# Patient Record
Sex: Female | Born: 2000 | Race: Black or African American | Hispanic: No | Marital: Single | Smoking: Never smoker
Health system: Southern US, Community
[De-identification: ages and names within clinical notes are randomized; demographics above are authoritative.]

## PROBLEM LIST (undated history)

## (undated) DIAGNOSIS — J45909 Unspecified asthma, uncomplicated: Secondary | ICD-10-CM

## (undated) DIAGNOSIS — Z789 Other specified health status: Secondary | ICD-10-CM

## (undated) HISTORY — PX: NO PAST SURGERIES: SHX2092

---
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
## 2018-05-29 ENCOUNTER — Encounter

## 2019-06-08 ENCOUNTER — Emergency Department (HOSPITAL_COMMUNITY)
Admission: EM | Admit: 2019-06-08 | Discharge: 2019-06-09 | Disposition: A | Discharge status: Home / Self Care | Payer: BLUE CROSS/BLUE SHIELD | Attending: Emergency Medicine | Admitting: Emergency Medicine

## 2019-06-08 ENCOUNTER — Other Ambulatory Visit: Payer: Self-pay

## 2019-06-08 ENCOUNTER — Encounter (HOSPITAL_COMMUNITY): Payer: Self-pay | Admitting: Emergency Medicine

## 2019-06-08 DIAGNOSIS — J45909 Unspecified asthma, uncomplicated: Secondary | ICD-10-CM | POA: Insufficient documentation

## 2019-06-08 DIAGNOSIS — J029 Acute pharyngitis, unspecified: Secondary | ICD-10-CM | POA: Diagnosis not present

## 2019-06-08 DIAGNOSIS — Z79899 Other long term (current) drug therapy: Secondary | ICD-10-CM | POA: Diagnosis not present

## 2019-06-08 LAB — GROUP A STREP BY PCR: Group A Strep by PCR: NOT DETECTED

## 2019-06-08 NOTE — ED Triage Notes (Signed)
Pt c/o sore throat and swollen tonsils. °

## 2019-06-08 NOTE — ED Provider Notes (Signed)
[facility]ERGENCY DEPARTMENT Provider Note   CSN: [id number] Arrival date & time: 06/08/19  1814     History   Chief Complaint Chief Complaint  Patient presents with   Sore Throat    HPI Samantha Rivas is a 18 y.o. female.     The history is provided by the patient. No language interpreter was used.  Sore Throat This is a new problem. The current episode started more than 2 days ago. The problem occurs constantly. The problem has been gradually worsening. Pertinent negatives include no chest pain, no abdominal pain, no headaches and no shortness of breath. The symptoms are aggravated by swallowing and eating. Nothing relieves the symptoms. She has tried a cold compress for the symptoms.    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 Authorizing Provider  albuterol (PROVENTIL HFA;VENTOLIN HFA) 108 (90 BASE) MCG/ACT inhaler Inhale 2 puffs into the lungs every 6 (six) hours as needed for wheezing. May use 10 min before exercise 09/04/12   Clovis Cao, MD  ibuprofen (MOTRIN IB) 200 MG tablet Take 2 tablets (400 mg total) by mouth every 6 (six) hours as needed. 07/01/17   Seabron Spates, CNM  metroNIDAZOLE (FLAGYL) 500 MG tablet Take 1 tablet (500 mg total) by mouth 2 (two) times daily. 07/22/17   Julianne Handler, CNM    Family  History Family History  Problem Relation Age of Onset   Glaucoma Other    Obesity Other    Hypertension Other    Cancer Other     Social History Social History   Tobacco Use   Smoking status: Never Smoker   Smokeless tobacco: Never Used  Substance Use Topics   Alcohol use: No   Drug use: No     Allergies   Penicillins, Nyquil [pseudoeph-doxylamine-dm-apap], Phenylephrine hcl, Red dye, and Robitussin [guaifenesin]   Review of Systems Review of Systems  Constitutional: Positive for appetite change. Negative for chills and fever.  HENT: Positive for sore throat and trouble swallowing. Negative for drooling.   Respiratory: Negative for cough, choking, shortness of breath and stridor.   Cardiovascular: Negative for chest pain.  Gastrointestinal: Negative for abdominal pain.  Neurological: Negative for headaches.     Physical Exam Updated Vital Signs BP (!) 107/58 (BP Location: Right Arm)    Pulse 77    Temp 100.3 F (37.9 C) (Oral)    Resp 16    SpO2 100%   Physical Exam Vitals signs and nursing note reviewed.  Constitutional:      General: She is not in acute  distress.    Appearance: She is well-developed. She is not diaphoretic.  HENT:     Head: Normocephalic and atraumatic.     Right Ear: Tympanic membrane normal.     Left Ear: Tympanic membrane normal.     Mouth/Throat:     Mouth: Mucous membranes are moist.     Pharynx: Uvula midline. Pharyngeal swelling and posterior oropharyngeal erythema present. No oropharyngeal exudate or uvula swelling.     Tonsils: No tonsillar exudate or tonsillar abscesses.  Eyes:     General: No scleral icterus.    Conjunctiva/sclera: Conjunctivae normal.  Neck:     Musculoskeletal: Normal range of motion.  Cardiovascular:     Rate and Rhythm: Normal rate and regular rhythm.     Heart sounds: Normal heart sounds. No murmur. No friction rub. No gallop.   Pulmonary:     Effort: Pulmonary effort is normal. No respiratory  distress.     Breath sounds: Normal breath sounds.  Abdominal:     General: Bowel sounds are normal. There is no distension.     Palpations: Abdomen is soft. There is no mass.     Tenderness: There is no abdominal tenderness. There is no guarding.  Skin:    General: Skin is warm and dry.  Neurological:     Mental Status: She is alert and oriented to person, place, and time.  Psychiatric:        Behavior: Behavior normal.      ED Treatments / Results  Labs (all labs ordered are listed, but only abnormal results are displayed) Labs Reviewed  GROUP A STREP BY PCR    EKG None  Radiology No results found.  Procedures Procedures (including critical care time)  Medications Ordered in ED Medications - No data to display   Initial Impression / Assessment and Plan / ED Course  I have reviewed the triage vital signs and the nursing notes.  Pertinent labs & imaging results that were available during my care of the patient were reviewed by me and considered in my medical decision making (see chart for details).  Clinical Course as of Jun 08 2303  Tue Jun 08, 2019  2254 Resp: 16 [ET]    Clinical Course User Index [ET] Francene Boyershompson, Elizabeth A, Student-PA       Pt afebrile without tonsillar exudate, negative strep. Presents with mild cervical lymphadenopathy, & dysphagia; diagnosis of viral pharyngitis. No abx indicated. DC w symptomatic tx for pain  Pt does not appear dehydrated, but did discuss importance of water rehydration. Presentation non concerning for PTA or infxn spread to soft tissue. No trismus or uvula deviation. Specific return precautions discussed. Pt able to drink water in ED without difficulty with intact air way. Recommended PCP follow up.  Final Clinical Impressions(s) / ED Diagnoses   Final diagnoses:  Viral pharyngitis    ED Discharge Orders    None       Arthor CaptainHarris, Laquesha Holcomb, PA-C 06/09/19 1651    Benjiman CorePickering, Nathan, MD 06/09/19 [id number]

## 2019-06-09 NOTE — Discharge Instructions (Addendum)
Follow these instructions at home:  Take over-the-counter and prescription medicines only as told by your health care provider.  If you were prescribed an antibiotic medicine, take it as told by your health care provider. Do not stop taking the antibiotic even if you start to feel better.  Do not give children aspirin because of the association with Reye syndrome.  Drink enough water and fluids to keep your urine clear or pale yellow.  Get a lot of rest.  Gargle with a salt-water mixture 3-4 times a day or as needed. To make a salt-water mixture, completely dissolve -1 tsp of salt in 1 cup of warm water.  If your health care provider approves, you may use throat lozenges or sprays to soothe your throat.  Contact a health care provider if:  You have large, tender lumps in your neck.  You have a rash.  You cough up green, yellow-brown, or bloody spit.  Get help right away if:  Your neck becomes stiff.  You drool or are unable to swallow liquids.  You cannot drink or take medicines without vomiting.  You have severe pain that does not go away, even after you take medicine.  You have trouble breathing, and it is not caused by a stuffy nose.  You have new pain and swelling in your joints such as the knees, ankles, wrists, or elbows.

## 2019-10-29 ENCOUNTER — Telehealth: Payer: BLUE CROSS/BLUE SHIELD | Admitting: Family

## 2019-10-29 DIAGNOSIS — Z20822 Contact with and (suspected) exposure to covid-19: Secondary | ICD-10-CM

## 2019-10-29 NOTE — Progress Notes (Signed)
E-Visit for .Corona. Virus Screening   Your current symptoms could be consistent with the coronavirus.  Many health care providers can now test patients at their office but not all are.  [facility] has multiple testing sites. For information on our COVID testing locations and hours go to [email or web address]  Please quarantine yourself while awaiting your test results.  We are enrolling you in our MyChart Home Montioring for COVID19 . Daily you will receive a questionnaire within the MyChart website. Our COVID 19 response team willl be monitoriing your responses daily. Please continue good preventive care measures, including:  frequent hand-washing, avoid touching your face, cover coughs/sneezes, stay out of crowds and keep a 6 foot distance from others.    COVID-19 is a respiratory illness with symptoms that are similar to the flu. Symptoms are typically mild to moderate, but there have been cases of severe illness and death due to the virus. The following symptoms may appear 2-14 days after exposure: . Fever . Cough . Shortness of breath or difficulty breathing . Chills . Repeated shaking with chills . Muscle pain . Headache . Sore throat . New loss of taste or smell . Fatigue . Congestion or runny nose . Nausea or vomiting . Diarrhea  If you develop fever/cough/breathlessness, please stay home for 10 days with improving symptoms and until you have had 24 hours of no fever (without taking a fever reducer).  Go to the nearest hospital ED for assessment if fever/cough/breathlessness are severe or illness seems like a threat to life.  It is vitally important that if you feel that you have an infection such as this virus or any other virus that you stay home and away from places where you may spread it to others.  You should avoid contact with people age 65 and older.   You should wear a mask or cloth face covering over your nose and mouth if you must be around other  people or animals, including pets (even at home). Try to stay at least 6 feet away from other people. This will protect the people around you.  You may also take acetaminophen (Tylenol) as needed for fever.   Reduce your risk of any infection by using the same precautions used for avoiding the common cold or flu:  . Wash your hands often with soap and warm water for at least 20 seconds.  If soap and water are not readily available, use an alcohol-based hand sanitizer with at least 60% alcohol.  . If coughing or sneezing, cover your mouth and nose by coughing or sneezing into the elbow areas of your shirt or coat, into a tissue or into your sleeve (not your hands). . Avoid shaking hands with others and consider head nods or verbal greetings only. . Avoid touching your eyes, nose, or mouth with unwashed hands.  . Avoid close contact with people who are sick. . Avoid places or events with large numbers of people in one location, like concerts or sporting events. . Carefully consider travel plans you have or are making. . If you are planning any travel outside or inside the [place], visit the CDC's Travelers' Health webpage for the latest health notices. . If you have some symptoms but not all symptoms, continue to monitor at home and seek medical attention if your symptoms worsen. . If you are having a medical emergency, call 911.  HOME CARE . Only take medications as instructed by your medical team. . Drink plenty of fluids and get   plenty of rest. . A steam or ultrasonic humidifier can help if you have congestion.   GET HELP RIGHT AWAY IF YOU HAVE EMERGENCY WARNING SIGNS** FOR COVID-19. If you or someone is showing any of these signs seek emergency medical care immediately. Call 911 or proceed to your closest emergency facility if: . You develop worsening high fever. . Trouble breathing . Bluish lips or face . Persistent pain or pressure in the chest . New confusion . Inability to wake or stay  awake . You cough up blood. . Your symptoms become more severe  **This list is not all possible symptoms. Contact your medical provider for any symptoms that are sever or concerning to you.   MAKE SURE YOU   Understand these instructions.  Will watch your condition.  Will get help right away if you are not doing well or get worse.  Your e-visit answers were reviewed by a board certified advanced clinical practitioner to complete your personal care plan.  Depending on the condition, your plan could have included both over the counter or prescription medications.  If there is a problem please reply once you have received a response from your provider.  Your safety is important to us.  If you have drug allergies check your prescription carefully.    You can use MyChart to ask questions about today's visit, request a non-urgent call back, or ask for a work or school excuse for 24 hours related to this e-Visit. If it has been greater than 24 hours you will need to follow up with your provider, or enter a new e-Visit to address those concerns. You will get an e-mail in the next two days asking about your experience.  I hope that your e-visit has been valuable and will speed your recovery. Thank you for using e-visits.   Greater than 5 minutes, yet less than 10 minutes of time have been spent researching, coordinating, and implementing care for this patient today.  Thank you for the details you included in the comment boxes. Those details are very helpful in determining the best course of treatment for you and help us to provide the best care.  

## 2020-04-20 ENCOUNTER — Other Ambulatory Visit: Payer: Self-pay

## 2020-04-20 ENCOUNTER — Emergency Department (HOSPITAL_COMMUNITY)
Admission: EM | Admit: 2020-04-20 | Discharge: 2020-04-20 | Disposition: A | Discharge status: Home / Self Care | Payer: BLUE CROSS/BLUE SHIELD | Attending: Emergency Medicine | Admitting: Emergency Medicine

## 2020-04-20 ENCOUNTER — Encounter (HOSPITAL_COMMUNITY): Payer: Self-pay | Admitting: Emergency Medicine

## 2020-04-20 DIAGNOSIS — B373 Candidiasis of vulva and vagina: Secondary | ICD-10-CM | POA: Diagnosis not present

## 2020-04-20 DIAGNOSIS — B379 Candidiasis, unspecified: Secondary | ICD-10-CM

## 2020-04-20 DIAGNOSIS — L299 Pruritus, unspecified: Secondary | ICD-10-CM | POA: Diagnosis present

## 2020-04-20 LAB — URINALYSIS, ROUTINE W REFLEX MICROSCOPIC
Bilirubin Urine: NEGATIVE
Glucose, UA: NEGATIVE mg/dL
Hgb urine dipstick: NEGATIVE
Ketones, ur: NEGATIVE mg/dL
Nitrite: NEGATIVE
Protein, ur: NEGATIVE mg/dL
Specific Gravity, Urine: 1.016 (ref 1.005–1.030)
pH: 7 (ref 5.0–8.0)

## 2020-04-20 LAB — GC/CHLAMYDIA PROBE AMP (~~LOC~~) NOT AT ARMC
Chlamydia: NEGATIVE
Comment: NEGATIVE
Comment: NORMAL
Neisseria Gonorrhea: NEGATIVE

## 2020-04-20 LAB — WET PREP, GENITAL
Clue Cells Wet Prep HPF POC: NONE SEEN
Sperm: NONE SEEN
Trich, Wet Prep: NONE SEEN

## 2020-04-20 MED ORDER — TIOCONAZOLE 6.5 % VA OINT: 1.0000 | TOPICAL_OINTMENT | Freq: Once | VAGINAL | 0 refills | Status: AC

## 2020-04-20 NOTE — ED Notes (Signed)
Patient Alert and oriented to baseline. Stable and ambulatory to baseline. Patient verbalized understanding of the discharge instructions.  Patient belongings were taken by the patient.   

## 2020-04-20 NOTE — ED Triage Notes (Signed)
Pt presents to ED POV. Pt states that she believes she has a yeast infection. No complaints other than vaginal itchiness.

## 2020-04-20 NOTE — ED Provider Notes (Signed)
[facility] EMERGENCY DEPARTMENT Provider Note   CSN: [id number] Arrival date & time: 04/20/20  0106     History Chief Complaint  Patient presents with  . vaginal itchiness    Samantha Rivas is a 19 y.o. female.  The history is provided by the patient.  Vaginal Itching This is a new problem. The current episode started more than 1 week ago (2 weeks ). The problem occurs constantly. The problem has not changed since onset.Pertinent negatives include no chest pain, no abdominal pain, no headaches and no shortness of breath. Nothing aggravates the symptoms. Nothing relieves the symptoms. She has tried nothing for the symptoms. The treatment provided no relief.  used 3 days of monistat no relief.       Past Medical History:  Diagnosis Date  . Asthma   . Medical history non-contributory     Patient Active Problem List   Diagnosis Date Noted  . 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 Well child check 06/19/2011  . VISUAL ACUITY, DECREASED 05/07/2010  . ACNE, MILD 05/07/2010  . 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 NO PAST SURGERIES       OB History    Gravida  0   Para  0   Term  0   Preterm  0   AB  0   Living  0     SAB  0   TAB  0   Ectopic  0   Multiple  0   Live Births  0           Family History  Problem Relation Age of Onset  . Glaucoma Other   . Obesity Other   . Hypertension Other   . Cancer Other     Social History   Tobacco Use  . Smoking status: Never Smoker  . Smokeless tobacco: Never Used  Substance Use Topics  . Alcohol use: No  . Drug use: No    Home Medications Prior to Admission medications   Medication Sig Start Date End Date Taking? Authorizing Provider  albuterol (PROVENTIL HFA;VENTOLIN HFA) 108 (90 BASE) MCG/ACT inhaler Inhale 2 puffs into the lungs every 6 (six) hours as needed for wheezing. May use 10 min before exercise 09/04/12  Yes Durwin Reges, MD    aspirin-acetaminophen-caffeine (EXCEDRIN MIGRAINE) [phone] MG tablet Take 2 tablets by mouth daily as needed for headache or migraine.   Yes [provider]  ibuprofen (MOTRIN IB) 200 MG tablet Take 2 tablets (400 mg total) by mouth every 6 (six) hours as needed. 07/01/17  Yes Aviva Signs, CNM    Allergies    Penicillins, Nyquil [pseudoeph-doxylamine-dm-apap], Phenylephrine hcl, Red dye, and Robitussin [guaifenesin]  Review of Systems   Review of Systems  Constitutional: Negative for unexpected weight change.  HENT: Negative for congestion.   Eyes: Negative for visual disturbance.  Respiratory: Negative for shortness of breath.   Cardiovascular: Negative for chest pain.  Gastrointestinal: Negative for abdominal pain.  Genitourinary: Negative for difficulty urinating, vaginal bleeding and vaginal discharge.  Musculoskeletal: Negative for arthralgias.  Neurological: Negative for headaches.  Psychiatric/Behavioral: Negative for confusion.  All other systems reviewed and are negative.   Physical Exam Updated Vital Signs BP (!) 112/44   Pulse 78   Temp 98.3 F (36.8 C) (Oral)   Resp 17   SpO2 99%   Physical Exam Vitals and nursing note reviewed.  Constitutional:      General: She  is not in acute distress.    Appearance: Normal appearance.  HENT:     Head: Normocephalic and atraumatic.     Nose: Nose normal.  Eyes:     Extraocular Movements: Extraocular movements intact.     Pupils: Pupils are equal, round, and reactive to light.  Cardiovascular:     Rate and Rhythm: Normal rate and regular rhythm.     Pulses: Normal pulses.     Heart sounds: Normal heart sounds.  Pulmonary:     Effort: Pulmonary effort is normal.     Breath sounds: Normal breath sounds.  Abdominal:     General: Abdomen is flat. Bowel sounds are normal.     Tenderness: There is no abdominal tenderness.  Genitourinary:    Comments: Monistat in the vault, chaperone present no CMT no  adnexal tenderness  Musculoskeletal:        General: Normal range of motion.     Cervical back: Normal range of motion and neck supple.  Skin:    General: Skin is warm and dry.     Capillary Refill: Capillary refill takes less than 2 seconds.  Neurological:     General: No focal deficit present.     Mental Status: She is alert and oriented to person, place, and time.     Deep Tendon Reflexes: Reflexes normal.  Psychiatric:        Mood and Affect: Mood normal.        Behavior: Behavior normal.     ED Results / Procedures / Treatments   Labs (all labs ordered are listed, but only abnormal results are displayed) Labs Reviewed  WET PREP, GENITAL - Abnormal; Notable for the following components:      Result Value   Yeast Wet Prep HPF POC PRESENT (*)    WBC, Wet Prep HPF POC MANY (*)    All other components within normal limits  URINALYSIS, ROUTINE W REFLEX MICROSCOPIC - Abnormal; Notable for the following components:   APPearance CLOUDY (*)    Leukocytes,Ua LARGE (*)    Bacteria, UA FEW (*)    All other components within normal limits  GC/CHLAMYDIA PROBE AMP ([place]) NOT AT [facility]    EKG None  Radiology No results found.  Procedures Procedures (including critical care time)  Medications Ordered in ED Medications - No data to display  ED Course  I have reviewed the triage vital signs and the nursing notes.  Pertinent labs & imaging results that were available during my care of the patient were reviewed by me and considered in my medical decision making (see chart for details).    Patient has a red dye allergy and cannot get diflucan as it has dye in it.  Will prescribe tioconazole and have patient follow up with GYN for ongoing care.  Patient will need a pap smear as she is sexually active.    Ricky Doan was evaluated in Emergency Department on 04/20/2020 for the symptoms described in the history of present illness. She was evaluated in the context of the global  COVID-19 pandemic, which necessitated consideration that the patient might be at risk for infection with the SARS-CoV-2 virus that causes COVID-19. Institutional protocols and algorithms that pertain to the evaluation of patients at risk for COVID-19 are in a state of rapid change based on information released by regulatory bodies including the CDC and federal and state organizations. These policies and algorithms were followed during the patient's care in the ED.   Final  Clinical Impression(s) / ED Diagnoses Return for intractable cough, coughing up blood,fevers >100.4 unrelieved by medication, shortness of breath, intractable vomiting, chest pain, shortness of breath, weakness,numbness, changes in speech, facial asymmetry,abdominal pain, passing out,Inability to tolerate liquids or food, cough, altered mental status or any concerns. No signs of systemic illness or infection. The patient is nontoxic-appearing on exam and vital signs are within normal limits.   I have reviewed the triage vital signs and the nursing notes. Pertinent labs &imaging results that were available during my care of the patient were reviewed by me and considered in my medical decision making (see chart for details).After history, exam, and medical workup I feel the patient has beenappropriately medically screened and is safe for discharge home. Pertinent diagnoses were discussed with the patient. Patient was given return precautions.   Falynn Ailey, MD 04/20/20 [phone]

## 2021-04-15 ENCOUNTER — Encounter (HOSPITAL_COMMUNITY): Payer: Self-pay | Admitting: Emergency Medicine

## 2021-04-15 ENCOUNTER — Other Ambulatory Visit: Payer: Self-pay

## 2021-04-15 ENCOUNTER — Emergency Department (HOSPITAL_COMMUNITY)
Admission: EM | Admit: 2021-04-15 | Discharge: 2021-04-15 | Disposition: A | Discharge status: Home / Self Care | Payer: 59 | Attending: Emergency Medicine | Admitting: Emergency Medicine

## 2021-04-15 DIAGNOSIS — J45909 Unspecified asthma, uncomplicated: Secondary | ICD-10-CM | POA: Diagnosis not present

## 2021-04-15 DIAGNOSIS — N3001 Acute cystitis with hematuria: Secondary | ICD-10-CM | POA: Insufficient documentation

## 2021-04-15 DIAGNOSIS — R300 Dysuria: Secondary | ICD-10-CM | POA: Diagnosis present

## 2021-04-15 LAB — URINALYSIS, ROUTINE W REFLEX MICROSCOPIC
Bilirubin Urine: NEGATIVE
Glucose, UA: NEGATIVE mg/dL
Ketones, ur: NEGATIVE mg/dL
Nitrite: NEGATIVE
Protein, ur: 100 mg/dL — AB
RBC / HPF: >50 RBC/hpf — ABNORMAL HIGH (ref 0–5)
Specific Gravity, Urine: 1.004 — ABNORMAL LOW (ref 1.005–1.030)
WBC, UA: >50 WBC/hpf — ABNORMAL HIGH (ref 0–5)
pH: 6 (ref 5.0–8.0)

## 2021-04-15 LAB — PREGNANCY, URINE: Preg Test, Ur: NEGATIVE

## 2021-04-15 MED ORDER — NITROFURANTOIN MONOHYD MACRO 100 MG PO CAPS
100.0000 mg | ORAL_CAPSULE | Freq: Once | ORAL | Status: AC
  Administered 2021-04-15: 100 mg via ORAL
  Filled 2021-04-15: qty 1

## 2021-04-15 MED ORDER — NITROFURANTOIN MONOHYD MACRO 100 MG PO CAPS
100.0000 mg | ORAL_CAPSULE | Freq: Two times a day (BID) | ORAL | 0 refills | Status: AC
Start: 2021-04-15 — End: ?

## 2021-04-15 NOTE — ED Triage Notes (Signed)
Pt reports some mild lower abdominal and uretheral discomfort (denies burning) that started around 10p tonight. Worse when urinating. Reports a pink tint to her urine. LMP 04/06/21.

## 2021-04-15 NOTE — Discharge Instructions (Addendum)
1. Medications: Macrobid, usual home medications 2. Treatment: rest, drink plenty of fluids, take medications as prescribed 3. Follow Up: Please followup with your primary doctor in 3 days for discussion of your diagnoses and further evaluation after today's visit; if you do not have a primary care doctor use the resource guide provided to find one; return to the ER for fevers, persistent vomiting, worsening abdominal pain or other concerning symptoms.

## 2021-04-15 NOTE — ED Provider Notes (Signed)
[facility]-EMERGENCY DEPT Provider Note   CSN: [id number] Arrival date & time: 04/15/21  0026     History Chief Complaint  Patient presents with  . Dysuria    Samantha Rivas is a 20 y.o. female presents to the Emergency Department complaining of gradual, persistent, progressively worsening lower abdominal pain and burning at the entrance to her urethra onset around 10 PM.  Patient reports that she held her urine longer than usual today and did not drink much water.  Has no history of UTI.  Reports she only has pain during urination.  Reports urine is "pink."  Denies vaginal discharge, back pain, fevers, chills, nausea, vomiting.  No treatments prior to arrival.  No alleviating factors.   The history is provided by the patient and medical records. No language interpreter was used.       Past Medical History:  Diagnosis Date  . Asthma   . Medical history non-contributory     Patient Active Problem List   Diagnosis Date Noted  . 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 Well child check 06/19/2011  . VISUAL ACUITY, DECREASED 05/07/2010  . ACNE, MILD 05/07/2010  . 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 NO PAST SURGERIES       OB History    Gravida  0   Para  0   Term  0   Preterm  0   AB  0   Living  0     SAB  0   IAB  0   Ectopic  0   Multiple  0   Live Births  0           Family History  Problem Relation Age of Onset  . Glaucoma Other   . Obesity Other   . Hypertension Other   . Cancer Other     Social History   Tobacco Use  . Smoking status: Never Smoker  . Smokeless tobacco: Never Used  Substance Use Topics  . Alcohol use: No  . Drug use: No    Home Medications Prior to Admission medications   Medication Sig Start Date End Date Taking? Authorizing Provider  nitrofurantoin, macrocrystal-monohydrate, (MACROBID) 100 MG capsule Take 1 capsule (100 mg total) by mouth 2 (two)  times daily. X 7 days 04/15/21  Yes Nekesha Font, Dahlia Client, PA-C  albuterol (PROVENTIL HFA;VENTOLIN HFA) 108 (90 BASE) MCG/ACT inhaler Inhale 2 puffs into the lungs every 6 (six) hours as needed for wheezing. May use 10 min before exercise 09/04/12   Durwin Reges, MD  aspirin-acetaminophen-caffeine [facility] MIGRAINE) [phone] MG tablet Take 2 tablets by mouth daily as needed for headache or migraine.    [provider]  ibuprofen (MOTRIN IB) 200 MG tablet Take 2 tablets (400 mg total) by mouth every 6 (six) hours as needed. 07/01/17   Aviva Signs, CNM    Allergies    Penicillins, Nyquil [pseudoeph-doxylamine-dm-apap], Phenylephrine hcl, Red dye, and Robitussin [guaifenesin]  Review of Systems   Review of Systems  Constitutional: Negative for appetite change, diaphoresis, fatigue, fever and unexpected weight change.  HENT: Negative for mouth sores.   Eyes: Negative for visual disturbance.  Respiratory: Negative for cough, chest tightness, shortness of breath and wheezing.   Cardiovascular: Negative for chest pain.  Gastrointestinal: Positive for abdominal pain. Negative for constipation, diarrhea, nausea and vomiting.  Endocrine: Negative for polydipsia, polyphagia and polyuria.  Genitourinary: Positive for dysuria. Negative for frequency, hematuria and urgency.  Musculoskeletal: Negative for back pain and neck stiffness.  Skin: Negative for rash.  Allergic/Immunologic: Negative for immunocompromised state.  Neurological: Negative for syncope, light-headedness and headaches.  Hematological: Does not bruise/bleed easily.  Psychiatric/Behavioral: Negative for sleep disturbance. The patient is not nervous/anxious.     Physical Exam Updated Vital Signs BP (!) 153/63 (BP Location: Right Arm)   Pulse 66   Temp 98.1 F (36.7 C) (Oral)   Resp 18   Ht 5\' 1"  (1.549 m)   Wt 69.9 kg   SpO2 100%   BMI 29.10 kg/m   Physical Exam Vitals and nursing note reviewed.   Constitutional:      General: She is not in acute distress.    Appearance: She is not diaphoretic.  HENT:     Head: Normocephalic.  Eyes:     General: No scleral icterus.    Conjunctiva/sclera: Conjunctivae normal.  Cardiovascular:     Rate and Rhythm: Normal rate and regular rhythm.     Pulses: Normal pulses.          Radial pulses are 2+ on the right side and 2+ on the left side.  Pulmonary:     Effort: No tachypnea, accessory muscle usage, prolonged expiration, respiratory distress or retractions.     Breath sounds: Normal breath sounds. No stridor.     Comments: Equal chest rise. No increased work of breathing. Abdominal:     General: There is no distension.     Palpations: Abdomen is soft.     Tenderness: There is no abdominal tenderness. There is no right CVA tenderness, left CVA tenderness, guarding or rebound.     Comments: No abdominal tenderness or CVA tenderness.  Musculoskeletal:     Cervical back: Normal range of motion.     Comments: Moves all extremities equally and without difficulty.  Skin:    General: Skin is warm and dry.     Capillary Refill: Capillary refill takes less than 2 seconds.  Neurological:     Mental Status: She is alert.     GCS: GCS eye subscore is 4. GCS verbal subscore is 5. GCS motor subscore is 6.     Comments: Speech is clear and goal oriented.  Psychiatric:        Mood and Affect: Mood normal.     ED Results / Procedures / Treatments   Labs (all labs ordered are listed, but only abnormal results are displayed) Labs Reviewed  URINALYSIS, ROUTINE W REFLEX MICROSCOPIC - Abnormal; Notable for the following components:      Result Value   APPearance HAZY (*)    Specific Gravity, Urine 1.004 (*)    Hgb urine dipstick LARGE (*)    Protein, ur 100 (*)    Leukocytes,Ua LARGE (*)    RBC / HPF >50 (*)    WBC, UA >50 (*)    Bacteria, UA RARE (*)    All other components within normal limits  PREGNANCY, URINE     EKG None  Radiology No results found.  Procedures Procedures   Medications Ordered in ED Medications  nitrofurantoin (macrocrystal-monohydrate) (MACROBID) capsule 100 mg (has no administration in time range)    ED Course  I have reviewed the triage vital signs and the nursing notes.  Pertinent labs & imaging results that were available during my care of the patient were reviewed by me and considered in my medical decision making (see chart for details).    MDM Rules/Calculators/A&P  Pt has been diagnosed with a UTI. Pt is afebrile, no CVA tenderness, normotensive, and denies N/V. Pt to be dc home with antibiotics and instructions to follow up with PCP if symptoms persist.   Final Clinical Impression(s) / ED Diagnoses Final diagnoses:  Acute cystitis with hematuria    Rx / DC Orders ED Discharge Orders         Ordered    nitrofurantoin, macrocrystal-monohydrate, (MACROBID) 100 MG capsule  2 times daily        04/15/21 0158           Calvin Chura, Dahlia Client, PA-C 04/15/21 0159    Cardama, Amadeo Garnet, MD 04/16/21 2123

## 2023-01-21 ENCOUNTER — Telehealth: Payer: Self-pay

## 2023-01-21 NOTE — Telephone Encounter (Signed)
Mychart msg sent. AS, CMA

## 2023-02-11 ENCOUNTER — Other Ambulatory Visit: Payer: Self-pay

## 2023-02-11 ENCOUNTER — Emergency Department (HOSPITAL_COMMUNITY)
Admission: EM | Admit: 2023-02-11 | Discharge: 2023-02-12 | Disposition: A | Discharge status: Home / Self Care | Payer: Medicaid Other | Attending: Emergency Medicine | Admitting: Emergency Medicine

## 2023-02-11 DIAGNOSIS — B9689 Other specified bacterial agents as the cause of diseases classified elsewhere: Secondary | ICD-10-CM | POA: Insufficient documentation

## 2023-02-11 DIAGNOSIS — E876 Hypokalemia: Secondary | ICD-10-CM | POA: Insufficient documentation

## 2023-02-11 DIAGNOSIS — J45909 Unspecified asthma, uncomplicated: Secondary | ICD-10-CM | POA: Diagnosis not present

## 2023-02-11 DIAGNOSIS — R109 Unspecified abdominal pain: Secondary | ICD-10-CM | POA: Diagnosis not present

## 2023-02-11 DIAGNOSIS — R1030 Lower abdominal pain, unspecified: Secondary | ICD-10-CM | POA: Diagnosis not present

## 2023-02-11 DIAGNOSIS — N760 Acute vaginitis: Secondary | ICD-10-CM | POA: Insufficient documentation

## 2023-02-11 LAB — COMPREHENSIVE METABOLIC PANEL
ALT: 50 U/L — ABNORMAL HIGH (ref 0–44)
AST: 35 U/L (ref 15–41)
Albumin: 4.2 g/dL (ref 3.5–5.0)
Alkaline Phosphatase: 55 U/L (ref 38–126)
Anion gap: 9 (ref 5–15)
BUN: 9 mg/dL (ref 6–20)
CO2: 25 mmol/L (ref 22–32)
Calcium: 9.1 mg/dL (ref 8.9–10.3)
Chloride: 103 mmol/L (ref 98–111)
Creatinine, Ser: 0.71 mg/dL (ref 0.44–1.00)
GFR, Estimated: >60 mL/min (ref >60)
Glucose, Bld: 102 mg/dL — ABNORMAL HIGH (ref 70–99)
Potassium: 3.1 mmol/L — ABNORMAL LOW (ref 3.5–5.1)
Sodium: 137 mmol/L (ref 135–145)
Total Bilirubin: 0.5 mg/dL (ref 0.3–1.2)
Total Protein: 8.2 g/dL — ABNORMAL HIGH (ref 6.5–8.1)

## 2023-02-11 LAB — URINALYSIS, ROUTINE W REFLEX MICROSCOPIC
Bacteria, UA: NONE SEEN
Glucose, UA: NEGATIVE mg/dL
Hgb urine dipstick: NEGATIVE
Ketones, ur: 5 mg/dL — AB
Leukocytes,Ua: NEGATIVE
Nitrite: NEGATIVE
Protein, ur: 30 mg/dL — AB
Specific Gravity, Urine: 1.03 (ref 1.005–1.030)
pH: 6 (ref 5.0–8.0)

## 2023-02-11 LAB — CBC
HCT: 36.6 % (ref 36.0–46.0)
Hemoglobin: 12.3 g/dL (ref 12.0–15.0)
MCH: 30.8 pg (ref 26.0–34.0)
MCHC: 33.6 g/dL (ref 30.0–36.0)
MCV: 91.5 fL (ref 80.0–100.0)
Platelets: 322 10*3/uL (ref 150–400)
RBC: 4 MIL/uL (ref 3.87–5.11)
RDW: 13 % (ref 11.5–15.5)
WBC: 10.3 10*3/uL (ref 4.0–10.5)
nRBC: 0 % (ref 0.0–0.2)

## 2023-02-11 LAB — LIPASE, BLOOD: Lipase: 36 U/L (ref 11–51)

## 2023-02-11 LAB — I-STAT BETA HCG BLOOD, ED (MC, WL, AP ONLY): I-stat hCG, quantitative: <5 m[IU]/mL (ref <5)

## 2023-02-11 NOTE — ED Provider Triage Note (Signed)
Emergency Medicine Provider Triage Evaluation Note  Samantha Rivas , a 22 y.o. female  was evaluated in triage.  Pt complains of lower abdominal pain for the past 2 days.  Cramping sensation to the right lower quadrant.  Endorsing nausea but has not had any episodes of vomiting.  Last menstrual cycle approximately 1 month ago, reports no concern for pregnancy at this time.  Has not take anything for improvement in symptoms.  Review of Systems  Positive: Abdominal pain Negative: Vomiting, fever, urinary symptoms.  Physical Exam  BP 130/87 (BP Location: Left Arm)   Pulse 74   Temp 98 F (36.7 C) (Oral)   Resp 18   SpO2 99%  Gen:   Awake, no distress   Resp:  Normal effort  MSK:   Moves extremities without difficulty  Other:    Medical Decision Making  Medically screening exam initiated at 9:41 PM.  Appropriate orders placed.  Samantha Rivas was informed that the remainder of the evaluation will be completed by another provider, this initial triage assessment does not replace that evaluation, and the importance of remaining in the ED until their evaluation is complete.     Samantha Fitting, PA-C 02/11/23 2144

## 2023-02-11 NOTE — ED Triage Notes (Signed)
Pt comes in with abdominal pain, more on the right, feels like "cramping" for 2 days. Nausea, no vomiting. Urine is "dark" but no frequency/burning.  Pt reports ongoing headache as well.  No diarrhea or constipation

## 2023-02-12 ENCOUNTER — Emergency Department (HOSPITAL_COMMUNITY): Payer: Medicaid Other

## 2023-02-12 DIAGNOSIS — R109 Unspecified abdominal pain: Secondary | ICD-10-CM | POA: Diagnosis not present

## 2023-02-12 LAB — GC/CHLAMYDIA PROBE AMP (~~LOC~~) NOT AT ARMC
Chlamydia: NEGATIVE
Comment: NEGATIVE
Comment: NORMAL
Neisseria Gonorrhea: NEGATIVE

## 2023-02-12 LAB — WET PREP, GENITAL
Sperm: NONE SEEN
Trich, Wet Prep: NONE SEEN
WBC, Wet Prep HPF POC: <10 (ref <10)
Yeast Wet Prep HPF POC: NONE SEEN

## 2023-02-12 LAB — HIV ANTIBODY (ROUTINE TESTING W REFLEX): HIV Screen 4th Generation wRfx: NONREACTIVE

## 2023-02-12 MED ORDER — POTASSIUM CHLORIDE CRYS ER 20 MEQ PO TBCR: 40.0000 meq | EXTENDED_RELEASE_TABLET | Freq: Every day | ORAL | 0 refills | Status: AC

## 2023-02-12 MED ORDER — METRONIDAZOLE 500 MG PO TABS: 500.0000 mg | ORAL_TABLET | Freq: Two times a day (BID) | ORAL | 0 refills | Status: AC

## 2023-02-12 MED ORDER — IOHEXOL 300 MG/ML  SOLN
100.0000 mL | Freq: Once | INTRAMUSCULAR | Status: AC | PRN
  Administered 2023-02-12: 100 mL via INTRAVENOUS

## 2023-02-12 NOTE — Discharge Instructions (Addendum)
You were seen in the emergency room today with lower abdominal pain.  I am treating you for bacterial vaginosis.  Please take the antibiotic as prescribed.  If you drink alcohol taking this medicine will make you very sick.   Please follow close with your primary care physician.  Your potassium was slightly low today needs to be rechecked after your medication.  Return with any new or suddenly worsening symptoms.

## 2023-02-12 NOTE — ED Provider Notes (Signed)
Emergency Department Provider Note   I have reviewed the triage vital signs and the nursing notes.   HISTORY  Chief Complaint Abdominal Pain   HPI Samantha Rivas is a 22 y.o. female with past history of asthma presents emergency department for evaluation of lower abdominal cramping pain with vaginal discharge and headache.  Symptoms have been ongoing for the past 2 days.  Denies any sharp/severe lower abdominal pain.  No diarrhea or vomiting.  No dysuria, hesitancy, urgency.  She is sexually active but does not have concern for sexually transmitted infection although has noticed some vaginal discharge as well.  With continued symptoms she presents to the ED for evaluation.   Past Medical History:  Diagnosis Date   Asthma    Medical history non-contributory     Review of Systems  Constitutional: No fever/chills Eyes: No visual changes. ENT: No sore throat. Cardiovascular: Denies chest pain. Respiratory: Denies shortness of breath. Gastrointestinal: Positive lower abdominal pain.  No nausea, no vomiting.  No diarrhea.  No constipation. Genitourinary: Negative for dysuria. Positive vaginal discharge.  Musculoskeletal: Negative for back pain. Skin: Negative for rash. Neurological: Negative for headaches, focal weakness or numbness.  ____________________________________________   PHYSICAL EXAM:  VITAL SIGNS: ED Triage Vitals [02/11/23 2115]  Enc Vitals Group     BP 130/87     Pulse Rate 74     Resp 18     Temp 98 F (36.7 C)     Temp Source Oral     SpO2 99 %   Constitutional: Alert and oriented. Well appearing and in no acute distress. Eyes: Conjunctivae are normal.  Head: Atraumatic. Nose: No congestion/rhinnorhea. Mouth/Throat: Mucous membranes are moist. Neck: No stridor.  Cardiovascular: Normal rate, regular rhythm. Good peripheral circulation. Grossly normal heart sounds.   Respiratory: Normal respiratory effort.  No retractions. Lungs  CTAB. Gastrointestinal: Soft and nontender. No distention.  Genitourinary: Exam performed with patient's verbal consent and nurse tech chaperone present.  Normal external genitalia.  Moderate vaginal discharge without bleeding.  No cervical motion tenderness or adnexal masses/tenderness. Musculoskeletal: No lower extremity tenderness nor edema. No gross deformities of extremities. Neurologic:  Normal speech and language. No gross focal neurologic deficits are appreciated.  Skin:  Skin is warm, dry and intact. No rash noted. ____________________________________________   LABS (all labs ordered are listed, but only abnormal results are displayed)  Labs Reviewed  WET PREP, GENITAL - Abnormal; Notable for the following components:      Result Value   Clue Cells Wet Prep HPF POC PRESENT (*)    All other components within normal limits  COMPREHENSIVE METABOLIC PANEL - Abnormal; Notable for the following components:   Potassium 3.1 (*)    Glucose, Bld 102 (*)    Total Protein 8.2 (*)    ALT 50 (*)    All other components within normal limits  URINALYSIS, ROUTINE W REFLEX MICROSCOPIC - Abnormal; Notable for the following components:   Bilirubin Urine SMALL (*)    Ketones, ur 5 (*)    Protein, ur 30 (*)    All other components within normal limits  LIPASE, BLOOD  CBC  HIV ANTIBODY (ROUTINE TESTING W REFLEX)  I-STAT BETA HCG BLOOD, ED ([facility], [facility], [facility] ONLY)  GC/CHLAMYDIA PROBE AMP () NOT AT [facility]   ____________________________________________  RADIOLOGY  CT ABDOMEN PELVIS W CONTRAST  Result Date: 02/12/2023 CLINICAL DATA:  Abdominal pain, acute, nonlocalized EXAM: CT ABDOMEN AND PELVIS WITH CONTRAST TECHNIQUE: Multidetector CT imaging of the  abdomen and pelvis was performed using the standard protocol following bolus administration of intravenous contrast. RADIATION DOSE REDUCTION: This exam was performed according to the departmental dose-optimization program which includes  automated exposure control, adjustment of the mA and/or kV according to patient size and/or use of iterative reconstruction technique. CONTRAST:  126m OMNIPAQUE IOHEXOL 300 MG/ML  SOLN COMPARISON:  None Available. FINDINGS: Lower chest: No acute abnormality. Hepatobiliary: No focal liver abnormality. No gallstones, gallbladder wall thickening, or pericholecystic fluid. No biliary dilatation. Pancreas: No focal lesion. Normal pancreatic contour. No surrounding inflammatory changes. No main pancreatic ductal dilatation. Spleen: Normal in size without focal abnormality. Adrenals/Urinary Tract: No adrenal nodule bilaterally. Bilateral kidneys enhance symmetrically. No hydronephrosis. No hydroureter. The urinary bladder is unremarkable. Stomach/Bowel: Stomach is within normal limits. No evidence of bowel wall thickening or dilatation. Appendix appears normal. Vascular/Lymphatic: No abdominal aorta or iliac aneurysm. Mild atherosclerotic plaque of the aorta and its branches. No abdominal, pelvic, or inguinal lymphadenopathy. Reproductive: Uterus and bilateral adnexa are unremarkable. Corpus luteum cyst within the left ovary. Other: No intraperitoneal free fluid. No intraperitoneal free gas. No organized fluid collection. Musculoskeletal: No abdominal wall hernia or abnormality. No suspicious lytic or blastic osseous lesions. No acute displaced fracture. IMPRESSION: No acute intra-abdominal or intrapelvic abnormality. Electronically Signed   By: MIven FinnM.D.   On: 02/12/2023 01:35    ____________________________________________   PROCEDURES  Procedure(s) performed:   Procedures  None  ____________________________________________   INITIAL IMPRESSION / ASSESSMENT AND PLAN / ED COURSE  Pertinent labs & imaging results that were available during my care of the patient were reviewed by me and considered in my medical decision making (see chart for details).   This patient is Presenting for  Evaluation of abdominal pain, which does require a range of treatment options, and is a complaint that involves a high risk of morbidity and mortality.  The Differential Diagnoses includes but is not exclusive to ectopic pregnancy, ovarian cyst, ovarian torsion, acute appendicitis, urinary tract infection, endometriosis, bowel obstruction, hernia, colitis, renal colic, gastroenteritis, volvulus etc.   Critical Interventions-    Medications  iohexol (OMNIPAQUE) 300 MG/ML solution 100 mL (100 mLs Intravenous Contrast Given 02/12/23 0103)    Reassessment after intervention: pain improved.    I did obtain Additional Historical Information from Mom at bedside.     Clinical Laboratory Tests Ordered, included pregnancy negative.  UA without infection.  Mild hypokalemia to 3.1 with normal kidney function.  No leukocytosis.  Radiologic Tests Ordered, included CT abdomen/pelvis. I independently interpreted the images and agree with radiology interpretation.   Social Determinants of Health Risk patient is a non-smoker.   Medical Decision Making: Summary:  Patient presents emergency department for evaluation of cramping abdominal pain and vaginal discharge.  No significant tenderness on bimanual exam to suspect PID.  Plan for CT abdomen pelvis and reassess.   Reevaluation with update and discussion with patient and Mom. Clue cells on wet prep. Plan for Flagyl. No acute findings on CT. Plan for symptom mgmt and abx as above. Will start on K supplementation as well.   Patient's presentation is most consistent with acute presentation with potential threat to life or bodily function.   Disposition: discharge  ____________________________________________  FINAL CLINICAL IMPRESSION(S) / ED DIAGNOSES  Final diagnoses:  Lower abdominal pain  Bacterial vaginosis  Hypokalemia     NEW OUTPATIENT MEDICATIONS STARTED DURING THIS VISIT:  New Prescriptions   METRONIDAZOLE (FLAGYL) 500 MG TABLET     Take 1  tablet (500 mg total) by mouth 2 (two) times daily for 7 days.   POTASSIUM CHLORIDE SA (KLOR-CON M) 20 MEQ TABLET    Take 2 tablets (40 mEq total) by mouth daily for 4 days.    Note:  This document was prepared using Dragon voice recognition software and may include unintentional dictation errors.  Nanda Quinton, MD, [facility] Emergency Medicine    Shan Padgett, [facility], MD 02/12/23 [phone]

## 2023-02-14 ENCOUNTER — Telehealth: Payer: Self-pay

## 2023-02-14 NOTE — Transitions of Care (Post Inpatient/ED Visit) (Signed)
   02/14/2023  Name: Paislynn Rheams MRN: [id number] DOB: 2001/07/04  Today's TOC FU Call Status: Today's TOC FU Call Status:: Unsuccessul Call (1st Attempt) Unsuccessful Call (1st Attempt) Date: 02/14/23  Attempted to reach the patient regarding the most recent Inpatient/ED visit.  Follow Up Plan: Additional outreach attempts will be made to reach the patient to complete the Transitions of Care (Post Inpatient/ED visit) call.   Mickel Fuchs, BSW, [place] Managed Medicaid Team  [phone]

## 2023-02-17 ENCOUNTER — Telehealth: Payer: Self-pay

## 2023-02-17 NOTE — Transitions of Care (Post Inpatient/ED Visit) (Signed)
   02/17/2023  Name: Samantha Rivas MRN: [id number] DOB: [birth year]  Today's TOC FU Call Status:    Attempted to reach the patient regarding the most recent Inpatient/ED visit.  Follow Up Plan: Additional outreach attempts will be made to reach the patient to complete the Transitions of Care (Post Inpatient/ED visit) call.   Mickel Fuchs, BSW, [place] Managed Medicaid Team  [phone]

## 2023-02-18 ENCOUNTER — Telehealth: Payer: Self-pay

## 2023-02-18 NOTE — Transitions of Care (Post Inpatient/ED Visit) (Signed)
   02/18/2023  Name: Samantha Rivas MRN: [id number] DOB: 12-Apr-2001  Today's TOC FU Call Status: Today's TOC FU Call Status:: Unsuccessful Call (3rd Attempt)  Attempted to reach the patient regarding the most recent Inpatient/ED visit.  Follow Up Plan: No further outreach attempts will be made at this time. We have been unable to contact the patient.  Mickel Fuchs, BSW, [place] Managed Medicaid Team  [phone]
